# Patient Record
Sex: Female | Born: 2005 | Race: White | Hispanic: No | Marital: Single
Health system: Southern US, Community
[De-identification: ages and names within clinical notes are randomized; demographics above are authoritative.]

## PROBLEM LIST (undated history)

## (undated) DIAGNOSIS — A1801 Tuberculosis of spine: Secondary | ICD-10-CM

## (undated) HISTORY — DX: Tuberculosis of spine: A18.01

---
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
## 2024-08-07 ENCOUNTER — Ambulatory Visit
Admission: EM | Admit: 2024-08-07 | Discharge: 2024-08-07 | Disposition: A | Discharge status: Home / Self Care | Payer: PRIVATE HEALTH INSURANCE | Attending: Nurse Practitioner | Admitting: Nurse Practitioner

## 2024-08-07 ENCOUNTER — Telehealth: Payer: Self-pay

## 2024-08-07 DIAGNOSIS — U071 COVID-19: Secondary | ICD-10-CM

## 2024-08-07 MED ORDER — PAXLOVID (300/100) 20 X 150 MG & 10 X 100MG PO TBPK: 3.0000 | ORAL_TABLET | Freq: Two times a day (BID) | ORAL | 0 refills | Status: AC

## 2024-08-07 MED ORDER — OSELTAMIVIR PHOSPHATE 75 MG PO CAPS: 75.0000 mg | ORAL_CAPSULE | Freq: Two times a day (BID) | ORAL | 0 refills | Status: AC

## 2024-08-07 MED ORDER — ALBUTEROL SULFATE HFA 108 (90 BASE) MCG/ACT IN AERS: 2.0000 | INHALATION_SPRAY | Freq: Four times a day (QID) | RESPIRATORY_TRACT | 0 refills | Status: AC | PRN

## 2024-08-07 MED ORDER — PROMETHAZINE-DM 6.25-15 MG/5ML PO SYRP: 5.0000 mL | ORAL_SOLUTION | Freq: Four times a day (QID) | ORAL | 0 refills | Status: AC | PRN

## 2024-08-07 NOTE — ED Provider Notes (Signed)
 " [facility]    CSN: [id number] Arrival date & time: 08/07/24  1054      History   Chief Complaint No chief complaint on file.   HPI Theresa Serrano is a 18 y.o. female.   The history is provided by the patient.   Patient presents for complaints of fever, cough, chills, chest tightness, headache, and bodyaches.  Symptoms started 2 days ago.  Patient states she tested positive for COVID at that time.  States her last fever was this morning which was around 102.  She denies ear pain, ear drainage, wheezing, difficulty breathing, abdominal pain, nausea, vomiting, diarrhea, or rash.  States she has been taking over-the-counter medications for her symptoms.  Past Medical History:  Diagnosis Date   Pott's disease     Patient Active Problem List   Diagnosis Date Noted   Pharyngitis 06/16/2021   POTS (postural orthostatic tachycardia syndrome) 03/15/2013    History reviewed. No pertinent surgical history.  OB History   No obstetric history on file.      Home Medications    Prior to Admission medications  Medication Sig Start Date End Date Taking? Authorizing Provider  albuterol (VENTOLIN HFA) 108 (90 Base) MCG/ACT inhaler Inhale 2 puffs into the lungs every 6 (six) hours as needed. 08/07/24  Yes Leath-Warren, Etta PARAS, NP  nirmatrelvir/ritonavir (PAXLOVID, 300/100,) 20 x 150 MG & 10 x 100MG  TBPK Take 3 tablets by mouth 2 (two) times daily for 5 days. Take nirmatrelvir (150 mg) two tablets twice daily for 5 days and ritonavir (100 mg) one tablet twice daily for 5 days. 08/07/24 08/12/24 Yes Leath-Warren, Etta PARAS, NP  promethazine -dextromethorphan (PROMETHAZINE -DM) 6.25-15 MG/5ML syrup Take 5 mLs by mouth 4 (four) times daily as needed. 08/07/24  Yes Leath-Warren, Etta PARAS, NP    Family History Family History  Problem Relation Age of Onset   Aneurysm Maternal Grandfather     Social History Social History[1]   Allergies   Patient has no known  allergies.   Review of Systems Review of Systems Per HPI  Physical Exam Triage Vital Signs ED Triage Vitals  Encounter Vitals Group     BP 08/07/24 1151 105/73     Girls Systolic BP Percentile --      Girls Diastolic BP Percentile --      Boys Systolic BP Percentile --      Boys Diastolic BP Percentile --      Pulse Rate 08/07/24 1151 99     Resp 08/07/24 1151 18     Temp 08/07/24 1151 99.2 F (37.3 C)     Temp Source 08/07/24 1151 Oral     SpO2 08/07/24 1151 96 %     Weight --      Height --      Head Circumference --      Peak Flow --      Pain Score 08/07/24 1155 5     Pain Loc --      Pain Education --      Exclude from Growth Chart --    No data found.  Updated Vital Signs BP 105/73 (BP Location: Right Arm)   Pulse 99   Temp 99.2 F (37.3 C) (Oral)   Resp 18   LMP 07/14/2024 (Exact Date)   SpO2 96%   Visual Acuity Right Eye Distance:   Left Eye Distance:   Bilateral Distance:    Right Eye Near:   Left Eye Near:    Bilateral Near:  Physical Exam Vitals and nursing note reviewed.  Constitutional:      General: She is not in acute distress.    Appearance: Normal appearance.  HENT:     Head: Normocephalic.     Right Ear: Tympanic membrane, ear canal and external ear normal.     Left Ear: Tympanic membrane, ear canal and external ear normal.     Nose: Rhinorrhea present.     Right Turbinates: Enlarged and swollen.     Left Turbinates: Enlarged and swollen.     Right Sinus: No maxillary sinus tenderness or frontal sinus tenderness.     Left Sinus: No maxillary sinus tenderness or frontal sinus tenderness.     Mouth/Throat:     Lips: Pink.     Mouth: Mucous membranes are moist.     Pharynx: Postnasal drip present. No pharyngeal swelling, oropharyngeal exudate, posterior oropharyngeal erythema or uvula swelling.     Comments: Cobblestoning present to posterior oropharynx  Eyes:     Extraocular Movements: Extraocular movements intact.      Conjunctiva/sclera: Conjunctivae normal.     Pupils: Pupils are equal, round, and reactive to light.  Cardiovascular:     Rate and Rhythm: Normal rate and regular rhythm.     Pulses: Normal pulses.     Heart sounds: Normal heart sounds.  Pulmonary:     Effort: Pulmonary effort is normal. No respiratory distress.     Breath sounds: Normal breath sounds. No stridor. No wheezing, rhonchi or rales.  Abdominal:     General: Bowel sounds are normal.     Palpations: Abdomen is soft.  Musculoskeletal:     Cervical back: Normal range of motion.  Skin:    General: Skin is warm and dry.  Neurological:     General: No focal deficit present.     Mental Status: She is alert and oriented to person, place, and time.  Psychiatric:        Mood and Affect: Mood normal.        Behavior: Behavior normal.      UC Treatments / Results  Labs (all labs ordered are listed, but only abnormal results are displayed) Labs Reviewed - No data to display  EKG   Radiology No results found.  Procedures Procedures (including critical care time)  Medications Ordered in UC Medications - No data to display  Initial Impression / Assessment and Plan / UC Course  I have reviewed the triage vital signs and the nursing notes.  Pertinent labs & imaging results that were available during my care of the patient were reviewed by me and considered in my medical decision making (see chart for details).  On exam, the patient's lung sounds are clear throughout, room air sats are at 96%.  Patient tested positive for COVID 2 days ago at home when her symptoms started, she is still in the window to begin Paxlovid which she has elected to do.  The patient denies any prior history of kidney disease, blood thinning medications, or being on any statin medications.  Will also provide symptomatic treatment with Promethazine  DM for the cough.  Supportive care recommendations were provided and discussed with the patient to include  fluids, rest, over-the-counter analgesics, warm salt water gargles, and use of a humidifier at nighttime during sleep.  Discussed indications with the patient regarding follow-up.  Patient was in agreement with this plan of care and verbalizes understanding.  All questions were answered.  Patient stable for discharge.   Final Clinical  Impressions(s) / UC Diagnoses   Final diagnoses:  COVID     Discharge Instructions      Take medication as prescribed. Increase fluids and allow for plenty of rest. You may continue over-the-counter Tylenol or ibuprofen  as needed for pain, fever, or general discomfort. Recommend the use of a humidifier in your bedroom at nighttime during sleep and sleeping elevated on pillows while cough symptoms persist. You do need to isolate while you have a fever.  You should stay home until you have been fever free for 24 hours with no medication. Symptoms should begin to improve over the next 5 to 7 days.  If symptoms fail to improve, or begin to worsen, you may follow-up in this clinic or with your primary care physician for further evaluation. Go to the emergency department if you experience difficulty breathing, chest pain, or other concern concerns. Follow-up as needed.     ED Prescriptions     Medication Sig Dispense Auth. Provider   promethazine -dextromethorphan (PROMETHAZINE -DM) 6.25-15 MG/5ML syrup Take 5 mLs by mouth 4 (four) times daily as needed. 118 mL Leath-Warren, Etta PARAS, NP   nirmatrelvir/ritonavir (PAXLOVID, 300/100,) 20 x 150 MG & 10 x 100MG  TBPK Take 3 tablets by mouth 2 (two) times daily for 5 days. Take nirmatrelvir (150 mg) two tablets twice daily for 5 days and ritonavir (100 mg) one tablet twice daily for 5 days. 30 tablet Leath-Warren, Etta PARAS, NP   albuterol (VENTOLIN HFA) 108 (90 Base) MCG/ACT inhaler Inhale 2 puffs into the lungs every 6 (six) hours as needed. 8 g Leath-Warren, Etta PARAS, NP      PDMP not reviewed this  encounter.     [1]  Social History Tobacco Use   Smoking status: Passive Smoke Exposure - Never Smoker   Smokeless tobacco: Never  Vaping Use   Vaping status: Never Used  Substance Use Topics   Alcohol use: No   Drug use: No     Gilmer Etta PARAS, NP 08/07/24 1242  "

## 2024-08-07 NOTE — Discharge Instructions (Addendum)
 Take medication as prescribed. Increase fluids and allow for plenty of rest. You may continue over-the-counter Tylenol or ibuprofen  as needed for pain, fever, or general discomfort. Recommend the use of a humidifier in your bedroom at nighttime during sleep and sleeping elevated on pillows while cough symptoms persist. You do need to isolate while you have a fever.  You should stay home until you have been fever free for 24 hours with no medication. Symptoms should begin to improve over the next 5 to 7 days.  If symptoms fail to improve, or begin to worsen, you may follow-up in this clinic or with your primary care physician for further evaluation. Go to the emergency department if you experience difficulty breathing, chest pain, or other concern concerns. Follow-up as needed.

## 2024-08-07 NOTE — Telephone Encounter (Signed)
 Pt and mother called stating pt came in today with flu like symptoms and tested positive at home for covid but called back later and states she thought the control line on her home test ment she was positive for covid. Pt states she did a new test and she was actually positive for flu A. Spoke with provider Chrisitie L. And she states it would be ok to send in the tamiflu for pt. Verified the pt had no allergies to the medication.

## 2024-08-07 NOTE — ED Triage Notes (Signed)
 Pt reports fever, cough, congestion, headache and body aches tested  positive for congestion
